# Patient Record
Sex: Male | Born: 1980 | Race: Black or African American | Hispanic: No | Marital: Single | Smoking: Current every day smoker
Health system: Southern US, Community
[De-identification: ages and names within clinical notes are randomized; demographics above are authoritative.]

---

## 2014-05-26 ENCOUNTER — Encounter (HOSPITAL_COMMUNITY): Payer: Self-pay | Admitting: Emergency Medicine

## 2014-05-26 ENCOUNTER — Emergency Department (HOSPITAL_COMMUNITY)
Admission: EM | Admit: 2014-05-26 | Discharge: 2014-05-26 | Disposition: A | Discharge status: Home / Self Care | Payer: Self-pay | Attending: Emergency Medicine | Admitting: Emergency Medicine

## 2014-05-26 DIAGNOSIS — B354 Tinea corporis: Secondary | ICD-10-CM | POA: Insufficient documentation

## 2014-05-26 DIAGNOSIS — K4090 Unilateral inguinal hernia, without obstruction or gangrene, not specified as recurrent: Secondary | ICD-10-CM | POA: Insufficient documentation

## 2014-05-26 DIAGNOSIS — Z792 Long term (current) use of antibiotics: Secondary | ICD-10-CM | POA: Insufficient documentation

## 2014-05-26 DIAGNOSIS — Z720 Tobacco use: Secondary | ICD-10-CM | POA: Insufficient documentation

## 2014-05-26 MED ORDER — DIPHENHYDRAMINE HCL 12.5 MG/5ML PO ELIX
25.0000 mg | ORAL_SOLUTION | Freq: Once | ORAL | Status: AC
  Administered 2014-05-26: 25 mg via ORAL
  Filled 2014-05-26: qty 10

## 2014-05-26 MED ORDER — CLOTRIMAZOLE 1 % EX CREA: TOPICAL_CREAM | CUTANEOUS | Status: DC

## 2014-05-26 NOTE — Discharge Instructions (Signed)
Inguinal Hernia, Adult Muscles help keep everything in the body in its proper place. But if a weak spot in the muscles develops, something can poke through. That is called a hernia. When this happens in the lower part of the belly (abdomen), it is called an inguinal hernia. (It takes its name from a part of the body in this region called the inguinal canal.) A weak spot in the wall of muscles lets some fat or part of the small intestine bulge through. An inguinal hernia can develop at any age. Men get them more often than women. CAUSES  In adults, an inguinal hernia develops over time.  It can be triggered by:  Suddenly straining the muscles of the lower abdomen.  Lifting heavy objects.  Straining to have a bowel movement. Difficult bowel movements (constipation) can lead to this.  Constant coughing. This may be caused by smoking or lung disease.  Being overweight.  Being pregnant.  Working at a job that requires long periods of standing or heavy lifting.  Having had an inguinal hernia before. One type can be an emergency situation. It is called a strangulated inguinal hernia. It develops if part of the small intestine slips through the weak spot and cannot get back into the abdomen. The blood supply can be cut off. If that happens, part of the intestine may die. This situation requires emergency surgery. SYMPTOMS  Often, a small inguinal hernia has no symptoms. It is found when a healthcare provider does a physical exam. Larger hernias usually have symptoms.   In adults, symptoms may include:  A lump in the groin. This is easier to see when the person is standing. It might disappear when lying down.  In men, a lump in the scrotum.  Pain or burning in the groin. This occurs especially when lifting, straining or coughing.  A dull ache or feeling of pressure in the groin.  Signs of a strangulated hernia can include:  A bulge in the groin that becomes very painful and tender to the  touch.  A bulge that turns red or purple.  Fever, nausea and vomiting.  Inability to have a bowel movement or to pass gas. DIAGNOSIS  To decide if you have an inguinal hernia, a healthcare provider will probably do a physical examination.  This will include asking questions about any symptoms you have noticed.  The healthcare provider might feel the groin area and ask you to cough. If an inguinal hernia is felt, the healthcare provider may try to slide it back into the abdomen.  Usually no other tests are needed. TREATMENT  Treatments can vary. The size of the hernia makes a difference. Options include:  Watchful waiting. This is often suggested if the hernia is small and you have had no symptoms.  No medical procedure will be done unless symptoms develop.  You will need to watch closely for symptoms. If any occur, contact your healthcare provider right away.  Surgery. This is used if the hernia is larger or you have symptoms.  Open surgery. This is usually an outpatient procedure (you will not stay overnight in a hospital). An cut (incision) is made through the skin in the groin. The hernia is put back inside the abdomen. The weak area in the muscles is then repaired by herniorrhaphy or hernioplasty. Herniorrhaphy: in this type of surgery, the weak muscles are sewn back together. Hernioplasty: a patch or mesh is used to close the weak area in the abdominal wall.  Laparoscopy.  In this procedure, a surgeon makes small incisions. A thin tube with a tiny video camera (called a laparoscope) is put into the abdomen. The surgeon repairs the hernia with mesh by looking with the video camera and using two long instruments. HOME CARE INSTRUCTIONS   After surgery to repair an inguinal hernia:  You will need to take pain medicine prescribed by your healthcare provider. Follow all directions carefully.  You will need to take care of the wound from the incision.  Your activity will be  restricted for awhile. This will probably include no heavy lifting for several weeks. You also should not do anything too active for a few weeks. When you can return to work will depend on the type of job that you have.  During "watchful waiting" periods, you should:  Maintain a healthy weight.  Eat a diet high in fiber (fruits, vegetables and whole grains).  Drink plenty of fluids to avoid constipation. This means drinking enough water and other liquids to keep your urine clear or pale yellow.  Do not lift heavy objects.  Do not stand for long periods of time.  Quit smoking. This should keep you from developing a frequent cough. SEEK MEDICAL CARE IF:   A bulge develops in your groin area.  You feel pain, a burning sensation or pressure in the groin. This might be worse if you are lifting or straining.  You develop a fever of more than 100.5 F (38.1 C). SEEK IMMEDIATE MEDICAL CARE IF:   Pain in the groin increases suddenly.  A bulge in the groin gets bigger suddenly and does not go down.  For men, there is sudden pain in the scrotum. Or, the size of the scrotum increases.  A bulge in the groin area becomes red or purple and is painful to touch.  You have nausea or vomiting that does not go away.  You feel your heart beating much faster than normal.  You cannot have a bowel movement or pass gas.  You develop a fever of more than 102.0 F (38.9 C). Document Released: 06/17/2008 Document Revised: 04/23/2011 Document Reviewed: 06/17/2008 [facility] Patient Information 2015 [place]. This information is not intended to replace advice given to you by your health care provider. Make sure you discuss any questions you have with your health care provider.   Body Ringworm Ringworm (tinea corporis) is a fungal infection of the skin on the body. This infection is not caused by worms, but is actually caused by a fungus. Fungus normally lives on the top of your skin and can be  useful. However, in the case of ringworms, the fungus grows out of control and causes a skin infection. It can involve any area of skin on the body and can spread easily from one person to another (contagious). Ringworm is a common problem for children, but it can affect adults as well. Ringworm is also often found in athletes, especially wrestlers who share equipment and mats.  CAUSES  Ringworm of the body is caused by a fungus called dermatophyte. It can spread by:  Touchingother people who are infected.  Touchinginfected pets.  Touching or sharingobjects that have been in contact with the infected person or pet (hats, combs, towels, clothing, sports equipment). SYMPTOMS   Itchy, raised red spots and bumps on the skin.  Ring-shaped rash.  Redness near the border of the rash with a clear center.  Dry and scaly skin on or around the rash. Not every person develops a  ring-shaped rash. Some develop only the red, scaly patches. DIAGNOSIS  Most often, ringworm can be diagnosed by performing a skin exam. Your caregiver may choose to take a skin scraping from the affected area. The sample will be examined under the microscope to see if the fungus is present.  TREATMENT  Body ringworm may be treated with a topical antifungal cream or ointment. Sometimes, an antifungal shampoo that can be used on your body is prescribed. You may be prescribed antifungal medicines to take by mouth if your ringworm is severe, keeps coming back, or lasts a long time.  HOME CARE INSTRUCTIONS   Only take over-the-counter or prescription medicines as directed by your caregiver.  Wash the infected area and dry it completely before applying yourcream or ointment.  When using antifungal shampoo to treat the ringworm, leave the shampoo on the body for 3-5 minutes before rinsing.   Wear loose clothing to stop clothes from rubbing and irritating the rash.  Wash or change your bed sheets every night while you have  the rash.  Have your pet treated by your veterinarian if it has the same infection. To prevent ringworm:   Practice good hygiene.  Wear sandals or shoes in public places and showers.  Do not share personal items with others.  Avoid touching red patches of skin on other people.  Avoid touching pets that have bald spots or wash your hands after doing so. SEEK MEDICAL CARE IF:   Your rash continues to spread after 7 days of treatment.  Your rash is not gone in 4 weeks.  The area around your rash becomes red, warm, tender, and swollen. Document Released: 01/27/2000 Document Revised: 10/24/2011 Document Reviewed: 08/13/2011 [facility] Patient Information 2015 [place], [place]. This information is not intended to replace advice given to you by your health care provider. Make sure you discuss any questions you have with your health care provider.

## 2014-05-26 NOTE — ED Notes (Signed)
Pt reports running and falling in the dirt around 2 weeks ago. Has healed abrasion to right hip but approx one week ago a raised rash appeared on chest and bilateral arms. Rash appears to be ringworm. No one else in family has similar rash. No other c/c. Says the rash does itch. Denies F/N/V/D.

## 2014-05-26 NOTE — ED Provider Notes (Signed)
CSN: [id number]     Arrival date & time 05/26/14  1906 History  This chart was scribed for non-physician practitioner, Marlon Peliffany Cherylee Rawlinson, PA-C working with No att. providers found by Angelene GiovanniEmmanuella Mensah, ED Scribe. The patient was seen in room WTR5/WTR5 and the patient's care was started at 7:54 PM    Chief Complaint  Patient presents with  . Rash  . Tinea?    The history is provided by the patient. No language interpreter was used.   HPI Comments: Donald Brandt is a 34 y.o. male who presents to the Emergency Department complaining of a gradually worsening itchy rash on left chest, bilateral arms, and abdomen onset one week ago. He reports that he was running around in the park and fell in the dirt about 2 weeks ago. He denies any fever, abdominal pain, or N/V/D. He denies any rashes on any other areas of his body. He denies any sick contacts.   He also c/o a gradually worsening hernia and would like to know if he needs to go see a specialist about it. - it is not causing him any pain. No change in bowel habits. No N/V/D or fevers.   History reviewed. No pertinent past medical history. History reviewed. No pertinent past surgical history. History reviewed. No pertinent family history. History  Substance Use Topics  . Smoking status: Current Some Day Smoker  . Smokeless tobacco: Not on file  . Alcohol Use: No    Review of Systems  Constitutional: Negative for fever.  Gastrointestinal: Negative for nausea, vomiting, abdominal pain and diarrhea.  Skin: Positive for rash.  All other systems reviewed and are negative.   Allergies  Review of patient's allergies indicates no known allergies.  Home Medications   Prior to Admission medications   Medication Sig Start Date End Date Taking? Authorizing Provider  PENICILLIN V POTASSIUM PO Take 1 tablet by mouth daily as needed (rash).   Yes Historical Provider, MD  clotrimazole (LOTRIMIN) 1 % cream Apply to affected area 2 times daily and  continue 5 days after rash has cleared 05/26/14   Marlon Peliffany Rane Blitch, PA-C   BP 126/75 mmHg  Pulse 68  Temp(Src) 99 F (37.2 C) (Oral)  Resp 16  SpO2 100% Physical Exam  Constitutional: He is oriented to person, place, and time. He appears well-developed and well-nourished. No distress.  HENT:  Head: Normocephalic and atraumatic.  Eyes: Conjunctivae and EOM are normal.  Neck: Neck supple. No tracheal deviation present.  Cardiovascular: Normal rate.   Pulmonary/Chest: Effort normal. No respiratory distress.  Abdominal: Bowel sounds are normal. There is no tenderness. There is no rigidity, no rebound and no guarding. A hernia is present. Hernia confirmed positive in the left inguinal area (soft, easily reduces. No induration or erythema). Hernia confirmed negative in the right inguinal area.  Musculoskeletal: Normal range of motion.  Lymphadenopathy:       Right: No inguinal adenopathy present.       Left: No inguinal adenopathy present.  Neurological: He is alert and oriented to person, place, and time.  Skin: Skin is warm and dry. Rash (scaly erythematous rash i na circular shape. excoriated. ) noted.     Psychiatric: He has a normal mood and affect. His behavior is normal.  Nursing note and vitals reviewed.   ED Course  Procedures (including critical care time) DIAGNOSTIC STUDIES: Oxygen Saturation is 100% on RA, normal by my interpretation.    COORDINATION OF CARE: 7:58 PM- Pt advised of plan for treatment  and pt agrees.    Labs Review Labs Reviewed - No data to display  Imaging Review No results found.   EKG Interpretation None      MDM   Final diagnoses:  Unilateral inguinal hernia without obstruction or gangrene, recurrence not specified  Tinea corporis   Rx: clotrimazole cream rx. Given patient education on how not to spread ring worm. Given return precautions, if rash is not improving in a couple of days it will need to be re-evaluated.   Patient given a  referral to [facility] for evaluation of hernia.  33 y.o.Kamdin Kranz's evaluation in the Emergency Department is complete. It has been determined that no acute conditions requiring further emergency intervention are present at this time. The patient/guardian have been advised of the diagnosis and plan. We have discussed signs and symptoms that warrant return to the ED, such as changes or worsening in symptoms.  Vital signs are stable at discharge. Filed Vitals:   05/26/14 1913  BP: 126/75  Pulse: 68  Temp: 99 F (37.2 C)  Resp: 16    Patient/guardian has voiced understanding and agreed to follow-up with the PCP or specialist.  I personally performed the services described in this documentation, which was scribed in my presence. The recorded information has been reviewed and is accurate.   Marlon Pel, PA-C 05/26/14 2028  Purvis Sheffield, MD 05/27/14 0000

## 2014-06-05 ENCOUNTER — Emergency Department (HOSPITAL_COMMUNITY)
Admission: EM | Admit: 2014-06-05 | Discharge: 2014-06-06 | Disposition: A | Discharge status: Home / Self Care | Payer: Self-pay | Attending: Emergency Medicine | Admitting: Emergency Medicine

## 2014-06-05 ENCOUNTER — Encounter (HOSPITAL_COMMUNITY): Payer: Self-pay | Admitting: Emergency Medicine

## 2014-06-05 ENCOUNTER — Emergency Department (HOSPITAL_COMMUNITY): Payer: Self-pay

## 2014-06-05 DIAGNOSIS — R059 Cough, unspecified: Secondary | ICD-10-CM

## 2014-06-05 DIAGNOSIS — J029 Acute pharyngitis, unspecified: Secondary | ICD-10-CM | POA: Insufficient documentation

## 2014-06-05 DIAGNOSIS — J3489 Other specified disorders of nose and nasal sinuses: Secondary | ICD-10-CM | POA: Insufficient documentation

## 2014-06-05 DIAGNOSIS — R6889 Other general symptoms and signs: Secondary | ICD-10-CM

## 2014-06-05 DIAGNOSIS — Z720 Tobacco use: Secondary | ICD-10-CM | POA: Insufficient documentation

## 2014-06-05 DIAGNOSIS — R509 Fever, unspecified: Secondary | ICD-10-CM | POA: Insufficient documentation

## 2014-06-05 DIAGNOSIS — R0981 Nasal congestion: Secondary | ICD-10-CM | POA: Insufficient documentation

## 2014-06-05 DIAGNOSIS — R05 Cough: Secondary | ICD-10-CM | POA: Insufficient documentation

## 2014-06-05 MED ORDER — ACETAMINOPHEN 325 MG PO TABS
650.0000 mg | ORAL_TABLET | Freq: Four times a day (QID) | ORAL | Status: DC | PRN
  Administered 2014-06-05: 650 mg via ORAL
  Filled 2014-06-05: qty 2

## 2014-06-05 NOTE — ED Notes (Signed)
Pt began running fever and feeling tired last Thursday. Pt reports taking Motrin this morning.

## 2014-06-06 LAB — CBC WITH DIFFERENTIAL/PLATELET
BASOS PCT: 0 % (ref 0–1)
Basophils Absolute: 0 10*3/uL (ref 0.0–0.1)
Eosinophils Absolute: 0.2 10*3/uL (ref 0.0–0.7)
Eosinophils Relative: 2 % (ref 0–5)
HEMATOCRIT: 41 % (ref 39.0–52.0)
HEMOGLOBIN: 13.4 g/dL (ref 13.0–17.0)
LYMPHS ABS: 1.7 10*3/uL (ref 0.7–4.0)
Lymphocytes Relative: 24 % (ref 12–46)
MCH: 29.5 pg (ref 26.0–34.0)
MCHC: 32.7 g/dL (ref 30.0–36.0)
MCV: 90.1 fL (ref 78.0–100.0)
MONO ABS: 0.9 10*3/uL (ref 0.1–1.0)
Monocytes Relative: 12 % (ref 3–12)
Neutro Abs: 4.3 10*3/uL (ref 1.7–7.7)
Neutrophils Relative %: 62 % (ref 43–77)
Platelets: 177 10*3/uL (ref 150–400)
RBC: 4.55 MIL/uL (ref 4.22–5.81)
RDW: 12.9 % (ref 11.5–15.5)
WBC: 7 10*3/uL (ref 4.0–10.5)

## 2014-06-06 LAB — COMPREHENSIVE METABOLIC PANEL
ALK PHOS: 47 U/L (ref 39–117)
ALT: 72 U/L — ABNORMAL HIGH (ref 0–53)
AST: 52 U/L — AB (ref 0–37)
Albumin: 4.4 g/dL (ref 3.5–5.2)
Anion gap: 4 — ABNORMAL LOW (ref 5–15)
BUN: 7 mg/dL (ref 6–23)
CO2: 27 mmol/L (ref 19–32)
CREATININE: 1.35 mg/dL (ref 0.50–1.35)
Calcium: 8.4 mg/dL (ref 8.4–10.5)
Chloride: 104 mmol/L (ref 96–112)
GFR calc non Af Amer: 67 mL/min — ABNORMAL LOW (ref >90)
GFR, EST AFRICAN AMERICAN: 77 mL/min — AB (ref >90)
Glucose, Bld: 95 mg/dL (ref 70–99)
Potassium: 3.6 mmol/L (ref 3.5–5.1)
SODIUM: 135 mmol/L (ref 135–145)
TOTAL PROTEIN: 7.6 g/dL (ref 6.0–8.3)
Total Bilirubin: 0.6 mg/dL (ref 0.3–1.2)

## 2014-06-06 MED ORDER — BENZONATATE 100 MG PO CAPS: 100.0000 mg | ORAL_CAPSULE | Freq: Three times a day (TID) | ORAL | Status: DC

## 2014-06-06 MED ORDER — IBUPROFEN 600 MG PO TABS: 600.0000 mg | ORAL_TABLET | Freq: Four times a day (QID) | ORAL | Status: DC | PRN

## 2014-06-06 MED ORDER — IBUPROFEN 800 MG PO TABS
800.0000 mg | ORAL_TABLET | Freq: Once | ORAL | Status: AC
  Administered 2014-06-06: 800 mg via ORAL
  Filled 2014-06-06: qty 1

## 2014-06-06 MED ORDER — SALINE SPRAY 0.65 % NA SOLN: 1.0000 | NASAL | Status: DC | PRN

## 2014-06-06 NOTE — Discharge Instructions (Signed)

## 2014-06-06 NOTE — ED Notes (Addendum)
Pt ambulated well in the hall---- pt's O2 sat remains 98% on room air; pt denies any shortness of breath while ambulating.

## 2014-06-06 NOTE — ED Provider Notes (Signed)
CSN: [id number]     Arrival date & time 06/05/14  2312 History   First MD Initiated Contact with Patient 06/06/14 0107     Chief Complaint  Patient presents with  . Fever    (Consider location/radiation/quality/duration/timing/severity/associated sxs/prior Treatment) HPI Comments: Patient is a 34 year old male who presents to the emergency department for further evaluation of fever. He reports that he began with a cough 3 days ago which has been dry and nonproductive. He states that he developed chills and a headache yesterday. Temperature at home was 102.69F. Patient took Motrin at noon yesterday and cough medication at 1700. He reports little improvement in his symptoms with this. He has had associated nasal congestion with rhinorrhea as well as a mild sore throat. Patient denies a headache now that his fever has resolved. He further denies vomiting, diarrhea, shortness of breath, and chest pain. He states his daughter is sick with similar symptoms.  Patient is a 34 y.o. male presenting with fever. The history is provided by the patient. No language interpreter was used.  Fever Associated symptoms: chills, congestion, cough, rhinorrhea and sore throat   Associated symptoms: no diarrhea and no vomiting     History reviewed. No pertinent past medical history. History reviewed. No pertinent past surgical history. History reviewed. No pertinent family history. History  Substance Use Topics  . Smoking status: Current Some Day Smoker  . Smokeless tobacco: Not on file  . Alcohol Use: No    Review of Systems  Constitutional: Positive for fever and chills.  HENT: Positive for congestion, rhinorrhea and sore throat.   Respiratory: Positive for cough. Negative for shortness of breath.   Gastrointestinal: Negative for vomiting and diarrhea.  Neurological: Negative for syncope.  All other systems reviewed and are negative.   Allergies  Review of patient's allergies indicates no known  allergies.  Home Medications   Prior to Admission medications   Medication Sig Start Date End Date Taking? Authorizing Provider  benzonatate (TESSALON) 100 MG capsule Take 1 capsule (100 mg total) by mouth every 8 (eight) hours. 06/06/14   Antony MaduraKelly Lestine Rahe, PA-C  clotrimazole (LOTRIMIN) 1 % cream Apply to affected area 2 times daily and continue 5 days after rash has cleared Patient not taking: Reported on 06/06/2014 05/26/14   Marlon Peliffany Greene, PA-C  ibuprofen (ADVIL,MOTRIN) 600 MG tablet Take 1 tablet (600 mg total) by mouth every 6 (six) hours as needed. 06/06/14   Antony MaduraKelly Marleena Shubert, PA-C  sodium chloride (OCEAN) 0.65 % SOLN nasal spray Place 1 spray into both nostrils as needed for congestion. 06/06/14   Antony MaduraKelly Nyeshia Mysliwiec, PA-C   BP 126/78 mmHg  Pulse 86  Temp(Src) 99.7 F (37.6 C) (Oral)  Resp 18  Ht 5\' 11"  (1.803 m)  Wt 156 lb (70.761 kg)  BMI 21.77 kg/m2  SpO2 98%   Physical Exam  Constitutional: He is oriented to person, place, and time. He appears well-developed and well-nourished. No distress.  HENT:  Head: Normocephalic and atraumatic.  Right Ear: Tympanic membrane, external ear and ear canal normal.  Left Ear: Tympanic membrane, external ear and ear canal normal.  Nose: Mucosal edema present. No rhinorrhea.  Mouth/Throat: Uvula is midline and oropharynx is clear and moist. No oropharyngeal exudate.  Uvula midline. Patient tolerating secretions without difficulty  Eyes: Conjunctivae and EOM are normal. No scleral icterus.  Neck: Normal range of motion.  No nuchal rigidity or meningismus  Cardiovascular: Normal rate, regular rhythm and intact distal pulses.   Pulmonary/Chest: Effort normal and breath  sounds normal. No respiratory distress. He has no wheezes. He has no rales.  Respirations even and unlabored. Lungs clear.  Abdominal: Soft. He exhibits no distension. There is no tenderness. There is no rebound.  Soft, nontender  Musculoskeletal: Normal range of motion.  Neurological: He is  alert and oriented to person, place, and time. He exhibits normal muscle tone. Coordination normal.  GCS 15. Speech is goal oriented. Patient moves extremities without ataxia.  Skin: Skin is warm and dry. No rash noted. He is not diaphoretic. No erythema. No pallor.  Psychiatric: He has a normal mood and affect. His behavior is normal.  Nursing note and vitals reviewed.   ED Course  Procedures (including critical care time) Labs Review Labs Reviewed  COMPREHENSIVE METABOLIC PANEL - Abnormal; Notable for the following:    AST 52 (*)    ALT 72 (*)    GFR calc non Af Amer 67 (*)    GFR calc Af Amer 77 (*)    Anion gap 4 (*)    All other components within normal limits  CBC WITH DIFFERENTIAL/PLATELET    Imaging Review Dg Chest 2 View  06/06/2014   CLINICAL DATA:  Fever and generalized malaise for 2 days.  EXAM: CHEST  2 VIEW  COMPARISON:  None.  FINDINGS: The heart size and mediastinal contours are within normal limits. Both lungs are clear. The visualized skeletal structures are unremarkable.  IMPRESSION: No active cardiopulmonary disease.   Electronically Signed   By: Davonna Belling M.D.   On: 06/06/2014 01:54     EKG Interpretation None      MDM   Final diagnoses:  Flu-like symptoms    Patient with symptoms consistent with influenza. Vitals are stable, low-grade fever. No signs of dehydration, tolerating PO's. Lungs are clear. Chest x-ray negative for pneumonia. Patient ambulatory without hypoxia in the ED. Discussed the cost versus benefit of Tamiflu treatment with the patient.  The patient understands that symptoms are greater than the recommended 24-48 hour window of treatment. Patient will be discharged with instructions to orally hydrate, rest, and use over-the-counter medications for body aches and fever. Patient also given a cough suppressant. Return precautions discussed and provided. Patient agreeable to plan with no unaddressed concerns. Patient discharged in good  condition; VSS.   Filed Vitals:   06/06/14 0043 06/06/14 0106 06/06/14 0131 06/06/14 0216  BP:   117/66 126/78  Pulse:   76 86  Temp: 100.5 F (38.1 C) 100.5 F (38.1 C) 100.3 F (37.9 C) 99.7 F (37.6 C)  TempSrc: Oral Oral Oral Oral  Resp:   20 18  Height:      Weight:      SpO2:   98% 98%       Antony Madura, PA-C 06/06/14 0254  April Palumbo, MD 06/06/14 0410

## 2015-01-29 ENCOUNTER — Emergency Department (INDEPENDENT_AMBULATORY_CARE_PROVIDER_SITE_OTHER)
Admission: EM | Admit: 2015-01-29 | Discharge: 2015-01-29 | Disposition: A | Discharge status: Home / Self Care | Payer: Self-pay | Source: Home / Self Care | Attending: Emergency Medicine | Admitting: Emergency Medicine

## 2015-01-29 ENCOUNTER — Encounter (HOSPITAL_COMMUNITY): Payer: Self-pay | Admitting: *Deleted

## 2015-01-29 DIAGNOSIS — R0981 Nasal congestion: Secondary | ICD-10-CM

## 2015-01-29 DIAGNOSIS — J069 Acute upper respiratory infection, unspecified: Secondary | ICD-10-CM

## 2015-01-29 MED ORDER — MOMETASONE FUROATE 50 MCG/ACT NA SUSP: 2.0000 | Freq: Every day | NASAL | Status: DC

## 2015-01-29 NOTE — ED Provider Notes (Signed)
CSN: [id number]     Arrival date & time 01/29/15  1735 History   First MD Initiated Contact with Patient 01/29/15 1835     Chief Complaint  Patient presents with  . Nasal Congestion   (Consider location/radiation/quality/duration/timing/severity/associated sxs/prior Treatment) HPI Comments: Patient presents with nasal congestion x 2 days. No fever or chills. No cough. No malaise. No sore throat. Using a nasal spray but ran out.   The history is provided by the patient.    History reviewed. No pertinent past medical history. History reviewed. No pertinent past surgical history. No family history on file. Social History  Substance Use Topics  . Smoking status: Current Every Day Smoker  . Smokeless tobacco: None  . Alcohol Use: Yes     Comment: occasional    Review of Systems  Constitutional: Negative for fever, chills and fatigue.  HENT: Positive for congestion. Negative for rhinorrhea, sinus pressure and sore throat.   Eyes: Negative.   Respiratory: Negative.   Skin: Negative.   Neurological: Negative.     Allergies  Review of patient's allergies indicates no known allergies.  Home Medications   Prior to Admission medications   Medication Sig Start Date End Date Taking? Authorizing Provider  benzonatate (TESSALON) 100 MG capsule Take 1 capsule (100 mg total) by mouth every 8 (eight) hours. 06/06/14   Antony MaduraKelly Humes, PA-C  clotrimazole (LOTRIMIN) 1 % cream Apply to affected area 2 times daily and continue 5 days after rash has cleared Patient not taking: Reported on 06/06/2014 05/26/14   Marlon Peliffany Greene, PA-C  ibuprofen (ADVIL,MOTRIN) 600 MG tablet Take 1 tablet (600 mg total) by mouth every 6 (six) hours as needed. 06/06/14   Antony MaduraKelly Humes, PA-C  mometasone (NASONEX) 50 MCG/ACT nasal spray Place 2 sprays into the nose daily. 01/29/15   Riki SheerMichelle G Young, PA-C  sodium chloride (OCEAN) 0.65 % SOLN nasal spray Place 1 spray into both nostrils as needed for congestion. 06/06/14   Antony MaduraKelly  Humes, PA-C   Meds Ordered and Administered this Visit  Medications - No data to display  BP 118/79 mmHg  Pulse 69  Temp(Src) 98.5 F (36.9 C) (Oral)  Resp 20  SpO2 99% No data found.   Physical Exam  Constitutional: He is oriented to person, place, and time. He appears well-developed and well-nourished. No distress.  HENT:  Head: Normocephalic and atraumatic.  Right Ear: External ear normal.  Left Ear: External ear normal.  Mouth/Throat: Oropharynx is clear and moist. No oropharyngeal exudate.  Nasal turbinates with erythema and mild swelling, clear drainage  Neck: Normal range of motion.  Pulmonary/Chest: Effort normal and breath sounds normal. No respiratory distress.  Lymphadenopathy:    He has no cervical adenopathy.  Neurological: He is alert and oriented to person, place, and time.  Skin: Skin is warm and dry. No rash noted. He is not diaphoretic.  Psychiatric: His behavior is normal.  Nursing note and vitals reviewed.   ED Course  Procedures (including critical care time)  Labs Review Labs Reviewed - No data to display  Imaging Review No results found.   Visual Acuity Review  Right Eye Distance:   Left Eye Distance:   Bilateral Distance:    Right Eye Near:   Left Eye Near:    Bilateral Near:         MDM   1. URI (upper respiratory infection)   2. Nasal congestion    No indication for an antibiotic today. Treat symptomatically with nasal spray, NSAID's and  fluids. F/U if worsens   Riki Sheer, PA-C 01/29/15 1610

## 2015-01-29 NOTE — Discharge Instructions (Signed)
Upper Respiratory Infection, Adult Most upper respiratory infections (URIs) are a viral infection of the air passages leading to the lungs. A URI affects the nose, throat, and upper air passages. The most common type of URI is nasopharyngitis and is typically referred to as "the common cold." URIs run their course and usually go away on their own. Most of the time, a URI does not require medical attention, but sometimes a bacterial infection in the upper airways can follow a viral infection. This is called a secondary infection. Sinus and middle ear infections are common types of secondary upper respiratory infections. Bacterial pneumonia can also complicate a URI. A URI can worsen asthma and chronic obstructive pulmonary disease (COPD). Sometimes, these complications can require emergency medical care and may be life threatening.  CAUSES Almost all URIs are caused by viruses. A virus is a type of germ and can spread from one person to another.  RISKS FACTORS You may be at risk for a URI if:   You smoke.   You have chronic heart or lung disease.  You have a weakened defense (immune) system.   You are very Atiyana Brandt or very old.   You have nasal allergies or asthma.  You work in crowded or poorly ventilated areas.  You work in health care facilities or schools. SIGNS AND SYMPTOMS  Symptoms typically develop 2-3 days after you come in contact with a cold virus. Most viral URIs last 7-10 days. However, viral URIs from the influenza virus (flu virus) can last 14-18 days and are typically more severe. Symptoms may include:   Runny or stuffy (congested) nose.   Sneezing.   Cough.   Sore throat.   Headache.   Fatigue.   Fever.   Loss of appetite.   Pain in your forehead, behind your eyes, and over your cheekbones (sinus pain).  Muscle aches.  DIAGNOSIS  Your health care provider may diagnose a URI by:  Physical exam.  Tests to check that your symptoms are not due to  another condition such as:  Strep throat.  Sinusitis.  Pneumonia.  Asthma. TREATMENT  A URI goes away on its own with time. It cannot be cured with medicines, but medicines may be prescribed or recommended to relieve symptoms. Medicines may help:  Reduce your fever.  Reduce your cough.  Relieve nasal congestion. HOME CARE INSTRUCTIONS   Take medicines only as directed by your health care provider.   Gargle warm saltwater or take cough drops to comfort your throat as directed by your health care provider.  Use a warm mist humidifier or inhale steam from a shower to increase air moisture. This may make it easier to breathe.  Drink enough fluid to keep your urine clear or pale yellow.   Eat soups and other clear broths and maintain good nutrition.   Rest as needed.   Return to work when your temperature has returned to normal or as your health care provider advises. You may need to stay home longer to avoid infecting others. You can also use a face mask and careful hand washing to prevent spread of the virus.  Increase the usage of your inhaler if you have asthma.   Do not use any tobacco products, including cigarettes, chewing tobacco, or electronic cigarettes. If you need help quitting, ask your health care provider. PREVENTION  The best way to protect yourself from getting a cold is to practice good hygiene.   Avoid oral or hand contact with people with cold  symptoms.   Wash your hands often if contact occurs.  There is no clear evidence that vitamin C, vitamin E, echinacea, or exercise reduces the chance of developing a cold. However, it is always recommended to get plenty of rest, exercise, and practice good nutrition.  SEEK MEDICAL CARE IF:   You are getting worse rather than better.   Your symptoms are not controlled by medicine.   You have chills.  You have worsening shortness of breath.  You have brown or red mucus.  You have yellow or brown nasal  discharge.  You have pain in your face, especially when you bend forward.  You have a fever.  You have swollen neck glands.  You have pain while swallowing.  You have white areas in the back of your throat. SEEK IMMEDIATE MEDICAL CARE IF:   You have severe or persistent:  Headache.  Ear pain.  Sinus pain.  Chest pain.  You have chronic lung disease and any of the following:  Wheezing.  Prolonged cough.  Coughing up blood.  A change in your usual mucus.  You have a stiff neck.  You have changes in your:  Vision.  Hearing.  Thinking.  Mood. MAKE SURE YOU:   Understand these instructions.  Will watch your condition.  Will get help right away if you are not doing well or get worse.   This information is not intended to replace advice given to you by your health care provider. Make sure you discuss any questions you have with your health care provider.   No signs of a bacterial infection or need for an antibiotic. Suggest use of Motrin 600mg  every 8 hours with Sudafed (behind the counter) for congestion. Nasal spray will help also. Drink plenty of fluids. If you worsen please return. Feel better.    Document Released: 07/25/2000 Document Revised: 06/15/2014 Document Reviewed: 05/06/2013 Elsevier Interactive Patient Education [employer] Inc.

## 2015-01-29 NOTE — ED Notes (Signed)
C/O nasal congestion x 2 days.  Has taken Nyquil and fiancee's nasal spray with relief, but it ran out.  Has felt slightly feverish.  Denies cough.

## 2015-01-29 NOTE — ED Notes (Signed)
Pt discharged in error 

## 2015-04-03 ENCOUNTER — Encounter (HOSPITAL_COMMUNITY): Payer: Self-pay | Admitting: *Deleted

## 2015-04-03 ENCOUNTER — Emergency Department (HOSPITAL_COMMUNITY): Payer: Self-pay

## 2015-04-03 ENCOUNTER — Emergency Department (HOSPITAL_COMMUNITY)
Admission: EM | Admit: 2015-04-03 | Discharge: 2015-04-03 | Disposition: A | Discharge status: Home / Self Care | Payer: Self-pay | Attending: Emergency Medicine | Admitting: Emergency Medicine

## 2015-04-03 DIAGNOSIS — Z7951 Long term (current) use of inhaled steroids: Secondary | ICD-10-CM | POA: Insufficient documentation

## 2015-04-03 DIAGNOSIS — Y9289 Other specified places as the place of occurrence of the external cause: Secondary | ICD-10-CM | POA: Insufficient documentation

## 2015-04-03 DIAGNOSIS — X58XXXA Exposure to other specified factors, initial encounter: Secondary | ICD-10-CM | POA: Insufficient documentation

## 2015-04-03 DIAGNOSIS — Y998 Other external cause status: Secondary | ICD-10-CM | POA: Insufficient documentation

## 2015-04-03 DIAGNOSIS — S83411A Sprain of medial collateral ligament of right knee, initial encounter: Secondary | ICD-10-CM | POA: Insufficient documentation

## 2015-04-03 DIAGNOSIS — Y9344 Activity, trampolining: Secondary | ICD-10-CM | POA: Insufficient documentation

## 2015-04-03 DIAGNOSIS — Z79899 Other long term (current) drug therapy: Secondary | ICD-10-CM | POA: Insufficient documentation

## 2015-04-03 DIAGNOSIS — J209 Acute bronchitis, unspecified: Secondary | ICD-10-CM | POA: Insufficient documentation

## 2015-04-03 DIAGNOSIS — F17210 Nicotine dependence, cigarettes, uncomplicated: Secondary | ICD-10-CM | POA: Insufficient documentation

## 2015-04-03 MED ORDER — IBUPROFEN 800 MG PO TABS: 800.0000 mg | ORAL_TABLET | Freq: Three times a day (TID) | ORAL | Status: DC

## 2015-04-03 MED ORDER — IBUPROFEN 800 MG PO TABS
800.0000 mg | ORAL_TABLET | Freq: Once | ORAL | Status: AC
  Administered 2015-04-03: 800 mg via ORAL
  Filled 2015-04-03: qty 1

## 2015-04-03 NOTE — ED Notes (Signed)
PT ambulated to [facility] with crutches that wife opened on her own not waiting for assistance from nursing staff. Pt refused ace wrao and knee imobalizer .

## 2015-04-03 NOTE — ED Notes (Signed)
Wife opening cover on crurches.

## 2015-04-03 NOTE — Discharge Instructions (Signed)
Combined Knee Ligament Sprain Combined knee ligament sprain is a tear of more than one of the major ligaments of the knee. The four knee ligaments are the anterior cruciate ligament (ACL), posterior cruciate ligament (PCL), medial collateral ligament (MCL) and lateral collateral ligament (LCL). Ligaments connect bones. They often cross a joint to hold the bones together. The ligaments of the knee keep the thigh bone (femur) and shinbone (tibia) in alignment. These ligaments allow the joint to move within a certain range of motion. Movement outside this range causes a ligament strain. Injury to multiple ligaments at the same time results in difficulty playing sports and in daily living. The most common multiple knee ligament injury involves the ACL and MCL. SYMPTOMS   A "popping" sound heard or felt at the time of injury.  Inability to continue activity after injury.  Inflammation of the knee within 6 hours after injury.  Possibly, deformity of the knee.  Inability to straighten the knee.  Feeling of the knee giving way or buckling.  Sometimes, locking of the knee, if the joint cartilage (meniscus) is injured.  Rarely, numbness, weakness, paralysis, discoloration, or coldness, due to nerve or blood vessel injury. CAUSES  Spraining of multiple ligaments occurs when a force is placed on the ligaments that exceeds their strength. This is often caused by a direct hit (trauma). It may also be caused by a non-contact injury (hyperextending the knee while twisting it).  RISK INCREASES WITH:  Contact sports (football, rugby, lacrosse). Sports that involve pivoting, jumping, cutting, or changing direction (basketball, gymnastics, soccer, volleyball). Sports on uneven ground (cross-country running, soccer).  Poor strength and/or flexibility.  Improper fitted or padded equipment. PREVENTION  Warm up and stretch properly before activity.  Maintain physical fitness:  Thigh, leg, and knee  flexibility.  Muscle strength and endurance.  Learn and use proper exercise technique.  Wear proper and well fitting equipment (correct length of cleats for surface). PROGNOSIS  Without treatment, the knee will continue to give way and become vulnerable to recurring injury. Recurring injury can happen during athletics or daily living. If the injury includes damage to a nerve or artery, the chance of a poor outcome increases. Surgery is often needed to regain stability of the knee. RELATED COMPLICATIONS  Frequently recurring symptoms, including:  Knee giving way.  Joint instability.  Inflammation.  Injury to the joint cartilage (meniscus). This may result in locking and/or swelling of the knee.  Injury to joint (articular) cartilage of the thigh bone or shinbone. This may result in arthritis of the knee.  Injury to other ligaments of the knee.  Knee stiffness (loss of knee motion).  Permanent injury to nerves (numbness, weakness, or paralysis) or arteries.  Removal (amputation) of the leg, due to nerve or artery injury. TREATMENT  Treatment first involves medicine and ice, to reduce pain and inflammation. Crutches may be advised, to decrease pain while walking. The knee may be restrained. Rehabilitation focuses on reducing swelling, regaining range of motion, and regaining muscle control and strength. It may also include receiving proper use training, wearing a brace, and education. (Avoid sports that involve pivoting, cutting, changing direction, jumping and landing). Surgery often offers the best chance for full recovery. Surgery from combined ACL/MCL injury involves replacement (reconstruction) of the ACL. This also allows for MCL healing. Despite surgery, some athletes may never return to their prior level of competition. The ability to return to sports depends on the related injuries and demands of the sport.  MEDICATION  If pain medicine is needed, nonsteroidal  anti-inflammatory medicines (aspirin and ibuprofen), or other minor pain relievers (acetaminophen), are often advised.  Do not take pain medicine for 7 days before surgery.  Stronger pain relievers may be prescribed. Use only as directed and only as much as you need.  Contact your caregiver immediately if any bleeding, stomach upset, or signs of an allergic reaction occur. COLD THERAPY  Cold treatment (icing) should be applied for 10 to 15 minutes every 2 to 3 hours for inflammation and pain, and immediately after activity that aggravates your symptoms. Use ice packs or an ice massage. SEEK MEDICAL CARE IF:   Symptoms get worse or do not improve in 6 weeks, despite treatment.  After injury or surgery, any of the following occur:  Pain, numbness, coldness, or a blue, gray, or dark color occurs in the foot or toenails.  Increased pain, swelling, redness, drainage of fluids, or bleeding in the affected area.  Signs of infection (headache, muscle aches, dizziness, or a general ill feeling with fever).  New, unexplained symptoms develop. (Drugs used in treatment may produce side effects.)   This information is not intended to replace advice given to you by your health care provider. Make sure you discuss any questions you have with your health care provider.   Document Released: 01/29/2005 Document Revised: 04/23/2011 Document Reviewed: 04/02/2014 Elsevier Interactive Patient Education 2016 [employer].   Adult nurse and RICE WHAT DOES AN ELASTIC BANDAGE DO? Elastic bandages come in different shapes and sizes. They generally provide support to your injury and reduce swelling while you are healing, but they can perform different functions. Your health care provider will help you to decide what is best for your protection, recovery, or rehabilitation following an injury. WHAT ARE SOME GENERAL TIPS FOR USING AN ELASTIC BANDAGE?  Use the bandage as directed by the maker of the bandage  that you are using.  Do not wrap the bandage too tightly. This may cut off the circulation in the arm or leg in the area below the bandage.  If part of your body beyond the bandage becomes blue, numb, cold, swollen, or is more painful, your bandage is most likely too tight. If this occurs, remove your bandage and reapply it more loosely.  See your health care provider if the bandage seems to be making your problems worse rather than better.  An elastic bandage should be removed and reapplied every 3-4 hours or as directed by your health care provider. WHAT IS RICE? The routine care of many injuries includes rest, ice, compression, and elevation (RICE therapy).  Rest Rest is required to allow your body to heal. Generally, you can resume your routine activities when you are comfortable and have been given permission by your health care provider. Ice Icing your injury helps to keep the swelling down and it reduces pain. Do not apply ice directly to your skin.  Put ice in a plastic bag.  Place a towel between your skin and the bag.  Leave the ice on for 20 minutes, 2-3 times per day. Do this for as long as you are directed by your health care provider. Compression Compression helps to keep swelling down, gives support, and helps with discomfort. Compression may be done with an elastic bandage. Elevation Elevation helps to reduce swelling and it decreases pain. If possible, your injured area should be placed at or above the level of your heart or the center of your chest. WHEN SHOULD I SEEK  MEDICAL CARE? You should seek medical care if:  You have persistent pain and swelling.  Your symptoms are getting worse rather than improving. These symptoms may indicate that further evaluation or further X-rays are needed. Sometimes, X-rays may not show a small broken bone (fracture) until a number of days later. Make a follow-up appointment with your health care provider. Ask when your X-ray results  will be ready. Make sure that you get your X-ray results. WHEN SHOULD I SEEK IMMEDIATE MEDICAL CARE? You should seek immediate medical care if:  You have a sudden onset of severe pain at or below the area of your injury.  You develop redness or increased swelling around your injury.  You have tingling or numbness at or below the area of your injury that does not improve after you remove the elastic bandage.   This information is not intended to replace advice given to you by your health care provider. Make sure you discuss any questions you have with your health care provider.   Document Released: 07/21/2001 Document Revised: 10/20/2014 Document Reviewed: 09/14/2013 Elsevier Interactive Patient Education 2016 Elsevier Inc.  Adult nurse and RICE WHAT DOES AN ELASTIC BANDAGE DO? Elastic bandages come in different shapes and sizes. They generally provide support to your injury and reduce swelling while you are healing, but they can perform different functions. Your health care provider will help you to decide what is best for your protection, recovery, or rehabilitation following an injury. WHAT ARE SOME GENERAL TIPS FOR USING AN ELASTIC BANDAGE?  Use the bandage as directed by the maker of the bandage that you are using.  Do not wrap the bandage too tightly. This may cut off the circulation in the arm or leg in the area below the bandage.  If part of your body beyond the bandage becomes blue, numb, cold, swollen, or is more painful, your bandage is most likely too tight. If this occurs, remove your bandage and reapply it more loosely.  See your health care provider if the bandage seems to be making your problems worse rather than better.  An elastic bandage should be removed and reapplied every 3-4 hours or as directed by your health care provider. WHAT IS RICE? The routine care of many injuries includes rest, ice, compression, and elevation (RICE therapy).  Rest Rest is required  to allow your body to heal. Generally, you can resume your routine activities when you are comfortable and have been given permission by your health care provider. Ice Icing your injury helps to keep the swelling down and it reduces pain. Do not apply ice directly to your skin.  Put ice in a plastic bag.  Place a towel between your skin and the bag.  Leave the ice on for 20 minutes, 2-3 times per day. Do this for as long as you are directed by your health care provider. Compression Compression helps to keep swelling down, gives support, and helps with discomfort. Compression may be done with an elastic bandage. Elevation Elevation helps to reduce swelling and it decreases pain. If possible, your injured area should be placed at or above the level of your heart or the center of your chest. WHEN SHOULD I SEEK MEDICAL CARE? You should seek medical care if:  You have persistent pain and swelling.  Your symptoms are getting worse rather than improving. These symptoms may indicate that further evaluation or further X-rays are needed. Sometimes, X-rays may not show a small broken bone (fracture) until a number of  days later. Make a follow-up appointment with your health care provider. Ask when your X-ray results will be ready. Make sure that you get your X-ray results. WHEN SHOULD I SEEK IMMEDIATE MEDICAL CARE? You should seek immediate medical care if:  You have a sudden onset of severe pain at or below the area of your injury.  You develop redness or increased swelling around your injury.  You have tingling or numbness at or below the area of your injury that does not improve after you remove the elastic bandage.   This information is not intended to replace advice given to you by your health care provider. Make sure you discuss any questions you have with your health care provider.   Document Released: 07/21/2001 Document Revised: 10/20/2014 Document Reviewed: 09/14/2013 Elsevier  Interactive Patient Education 2016 Elsevier Inc.    Acute Bronchitis Bronchitis is inflammation of the airways that extend from the windpipe into the lungs (bronchi). The inflammation often causes mucus to develop. This leads to a cough, which is the most common symptom of bronchitis.  In acute bronchitis, the condition usually develops suddenly and goes away over time, usually in a couple weeks. Smoking, allergies, and asthma can make bronchitis worse. Repeated episodes of bronchitis may cause further lung problems.  CAUSES Acute bronchitis is most often caused by the same virus that causes a cold. The virus can spread from person to person (contagious) through coughing, sneezing, and touching contaminated objects. SIGNS AND SYMPTOMS   Cough.   Fever.   Coughing up mucus.   Body aches.   Chest congestion.   Chills.   Shortness of breath.   Sore throat.  DIAGNOSIS  Acute bronchitis is usually diagnosed through a physical exam. Your health care provider will also ask you questions about your medical history. Tests, such as chest X-rays, are sometimes done to rule out other conditions.  TREATMENT  Acute bronchitis usually goes away in a couple weeks. Oftentimes, no medical treatment is necessary. Medicines are sometimes given for relief of fever or cough. Antibiotic medicines are usually not needed but may be prescribed in certain situations. In some cases, an inhaler may be recommended to help reduce shortness of breath and control the cough. A cool mist vaporizer may also be used to help thin bronchial secretions and make it easier to clear the chest.  HOME CARE INSTRUCTIONS  Get plenty of rest.   Drink enough fluids to keep your urine clear or pale yellow (unless you have a medical condition that requires fluid restriction). Increasing fluids may help thin your respiratory secretions (sputum) and reduce chest congestion, and it will prevent dehydration.   Take  medicines only as directed by your health care provider.  If you were prescribed an antibiotic medicine, finish it all even if you start to feel better.  Avoid smoking and secondhand smoke. Exposure to cigarette smoke or irritating chemicals will make bronchitis worse. If you are a smoker, consider using nicotine gum or skin patches to help control withdrawal symptoms. Quitting smoking will help your lungs heal faster.   Reduce the chances of another bout of acute bronchitis by washing your hands frequently, avoiding people with cold symptoms, and trying not to touch your hands to your mouth, nose, or eyes.   Keep all follow-up visits as directed by your health care provider.  SEEK MEDICAL CARE IF: Your symptoms do not improve after 1 week of treatment.  SEEK IMMEDIATE MEDICAL CARE IF:  You develop an increased fever or  chills.   You have chest pain.   You have severe shortness of breath.  You have bloody sputum.   You develop dehydration.  You faint or repeatedly feel like you are going to pass out.  You develop repeated vomiting.  You develop a severe headache. MAKE SURE YOU:   Understand these instructions.  Will watch your condition.  Will get help right away if you are not doing well or get worse.   This information is not intended to replace advice given to you by your health care provider. Make sure you discuss any questions you have with your health care provider.   Document Released: 03/08/2004 Document Revised: 02/19/2014 Document Reviewed: 07/22/2012 Elsevier Interactive Patient Education [employer]

## 2015-04-03 NOTE — ED Notes (Signed)
Pt reports RT knee pain after [place] on [place]. Pt reports he could not sleep last night due to pain. Pt has applied ice to RT knee and took  Ibuprofen with out relief. Pt secondary complaint is a cough for 2 weeks.

## 2015-04-03 NOTE — ED Provider Notes (Signed)
CSN: [id number]     Arrival date & time 04/03/15  0747 History   First MD Initiated Contact with Patient 04/03/15 [phone]     Chief Complaint  Patient presents with  . Knee Pain  . Cough     (Consider location/radiation/quality/duration/timing/severity/associated sxs/prior Treatment) HPI Chief complaint is any injury. Patient was at trampoline park yesterday and with a certain jump he heard a pop in the knee on the right. He reports since then he has had severe pain on the inside of his right knee. He has been limping continuously since the injury. He reports it hurts to put weight on it or to bend it significantly.  Second complaint is cough. Patient has had a cough for approximately 2 weeks. Family members have also had cough. No fever. Intermittent sputum production. No shortness of breath. Copious nasal discharge. No chest pain. No general myalgias or GI symptoms. History reviewed. No pertinent past medical history. History reviewed. No pertinent past surgical history. History reviewed. No pertinent family history. Social History  Substance Use Topics  . Smoking status: Current Every Day Smoker    Types: Cigarettes  . Smokeless tobacco: None  . Alcohol Use: Yes     Comment: occasional    Review of Systems  10 Systems reviewed and are negative for acute change except as noted in the HPI.  Allergies  Review of patient's allergies indicates no known allergies.  Home Medications   Prior to Admission medications   Medication Sig Start Date End Date Taking? Authorizing Provider  ibuprofen (ADVIL,MOTRIN) 600 MG tablet Take 1 tablet (600 mg total) by mouth every 6 (six) hours as needed. Patient taking differently: Take 600 mg by mouth 2 (two) times daily as needed for moderate pain.  06/06/14  Yes Antony Madura, PA-C  benzonatate (TESSALON) 100 MG capsule Take 1 capsule (100 mg total) by mouth every 8 (eight) hours. Patient not taking: Reported on 04/03/2015 06/06/14   Antony Madura, PA-C   clotrimazole (LOTRIMIN) 1 % cream Apply to affected area 2 times daily and continue 5 days after rash has cleared Patient not taking: Reported on 04/03/2015 05/26/14   Marlon Pel, PA-C  ibuprofen (ADVIL,MOTRIN) 800 MG tablet Take 1 tablet (800 mg total) by mouth 3 (three) times daily. 04/03/15   Arby Barrette, MD  mometasone (NASONEX) 50 MCG/ACT nasal spray Place 2 sprays into the nose daily. Patient not taking: Reported on 04/03/2015 01/29/15   Riki Sheer, PA-C  sodium chloride (OCEAN) 0.65 % SOLN nasal spray Place 1 spray into both nostrils as needed for congestion. Patient not taking: Reported on 04/03/2015 06/06/14   Antony Madura, PA-C   BP 112/82 mmHg  Pulse 61  Temp(Src) 98.4 F (36.9 C) (Oral)  Resp 16  Ht  (1.803 m)  Wt 160 lb (72.576 kg)  BMI 22.33 kg/m2  SpO2 99% Physical Exam  Constitutional: He is oriented to person, place, and time. He appears well-developed and well-nourished.  HENT:  Head: Normocephalic and atraumatic.  Eyes: EOM are normal. Pupils are equal, round, and reactive to light.  Neck: Neck supple.  Cardiovascular: Normal rate, regular rhythm, normal heart sounds and intact distal pulses.   Pulmonary/Chest: Effort normal and breath sounds normal.  Abdominal: Soft. Bowel sounds are normal. He exhibits no distension. There is no tenderness.  Musculoskeletal: Normal range of motion. He exhibits tenderness. He exhibits no edema.  There is no joint effusion to the right knee. Patient has local tenderness to palpation on the medial  knee along the joint line both superior and inferiorly. Pain with flexion. The popliteal fossa tenderness. After soft and nontender. No direct patellar tenderness.  Neurological: He is alert and oriented to person, place, and time. He has normal strength. Coordination normal. GCS eye subscore is 4. GCS verbal subscore is 5. GCS motor subscore is 6.  Skin: Skin is warm, dry and intact.  Psychiatric: He has a normal mood and  affect.    ED Course  Procedures (including critical care time) Labs Review Labs Reviewed - No data to display  Imaging Review Dg Knee Complete 4 Views Right  04/03/2015  CLINICAL DATA:  Pt jumped off of the trampoline yesterday at his house, and landed on the drive way. Today he is having right medial knee pain and swelling. No hx of injury or surgery to the right knee EXAM: RIGHT KNEE - COMPLETE 4+ VIEW COMPARISON:  None. FINDINGS: There is no evidence of fracture, dislocation, or joint effusion. There is no evidence of arthropathy or other focal bone abnormality. Soft tissues are unremarkable. IMPRESSION: Negative. Electronically Signed   By: Amie Portland M.D.   On: 04/03/2015 09:09   I have personally reviewed and evaluated these images and lab results as part of my medical decision-making.   EKG Interpretation None      MDM   Final diagnoses:  Medial collateral ligament sprain of knee, right, initial encounter  Acute bronchitis, unspecified organism   Patient has acute knee injury that is most likely a medial collateral injury. The gross effusion is present. X-ray does not show any tibial plateau fracture. he'll be placed in a knee immobilizer with an Ace wrap. Crutches are provided. Plan is for orthopedic follow-up.  Patient has findings of an acute bronchitis. He has had cough for 2 weeks. He is well. Breath sounds are clear. He is counseled on signs and symptoms were to return. He is still appropriate for symptomatic treatment of over-the-counter medications if needed.    Arby Barrette, MD 04/03/15 [phone]

## 2015-05-02 ENCOUNTER — Emergency Department (INDEPENDENT_AMBULATORY_CARE_PROVIDER_SITE_OTHER)
Admission: EM | Admit: 2015-05-02 | Discharge: 2015-05-02 | Disposition: A | Discharge status: Home / Self Care | Payer: Self-pay | Source: Home / Self Care | Attending: Family Medicine | Admitting: Family Medicine

## 2015-05-02 ENCOUNTER — Encounter (HOSPITAL_COMMUNITY): Payer: Self-pay | Admitting: Emergency Medicine

## 2015-05-02 ENCOUNTER — Other Ambulatory Visit (HOSPITAL_COMMUNITY)
Admission: RE | Admit: 2015-05-02 | Discharge: 2015-05-02 | Disposition: A | Discharge status: Home / Self Care | Payer: Self-pay | Source: Ambulatory Visit | Attending: Family Medicine | Admitting: Family Medicine

## 2015-05-02 DIAGNOSIS — L0231 Cutaneous abscess of buttock: Secondary | ICD-10-CM | POA: Insufficient documentation

## 2015-05-02 MED ORDER — HYDROCODONE-ACETAMINOPHEN 5-325 MG PO TABS: 1.0000 | ORAL_TABLET | ORAL | Status: AC | PRN

## 2015-05-02 MED ORDER — LIDOCAINE HCL 2 % IJ SOLN
INTRAMUSCULAR | Status: AC
  Filled 2015-05-02: qty 20

## 2015-05-02 MED ORDER — SULFAMETHOXAZOLE-TRIMETHOPRIM 800-160 MG PO TABS: 1.0000 | ORAL_TABLET | Freq: Two times a day (BID) | ORAL | Status: AC

## 2015-05-02 MED ORDER — LIDOCAINE HCL (PF) 1 % IJ SOLN
INTRAMUSCULAR | Status: AC
  Filled 2015-05-02: qty 5

## 2015-05-02 NOTE — Discharge Instructions (Signed)
Abscess °An abscess is an infected area that contains a collection of pus and debris. It can occur in almost any part of the body. An abscess is also known as a furuncle or boil. °CAUSES  °An abscess occurs when tissue gets infected. This can occur from blockage of oil or sweat glands, infection of hair follicles, or a minor injury to the skin. As the body tries to fight the infection, pus collects in the area and creates pressure under the skin. This pressure causes pain. People with weakened immune systems have difficulty fighting infections and get certain abscesses more often.  °SYMPTOMS °Usually an abscess develops on the skin and becomes a painful mass that is red, warm, and tender. If the abscess forms under the skin, you may feel a moveable soft area under the skin. Some abscesses break open (rupture) on their own, but most will continue to get worse without care. The infection can spread deeper into the body and eventually into the bloodstream, causing you to feel ill.  °DIAGNOSIS  °Your caregiver will take your medical history and perform a physical exam. A sample of fluid may also be taken from the abscess to determine what is causing your infection. °TREATMENT  °Your caregiver may prescribe antibiotic medicines to fight the infection. However, taking antibiotics alone usually does not cure an abscess. Your caregiver may need to make a small cut (incision) in the abscess to drain the pus. In some cases, gauze is packed into the abscess to reduce pain and to continue draining the area. °HOME CARE INSTRUCTIONS  °· Only take over-the-counter or prescription medicines for pain, discomfort, or fever as directed by your caregiver. °· If you were prescribed antibiotics, take them as directed. Finish them even if you start to feel better. °· If gauze is used, follow your caregiver's directions for changing the gauze. °· To avoid spreading the infection: °· Keep your draining abscess covered with a  bandage. °· Wash your hands well. °· Do not share personal care items, towels, or whirlpools with others. °· Avoid skin contact with others. °· Keep your skin and clothes clean around the abscess. °· Keep all follow-up appointments as directed by your caregiver. °SEEK MEDICAL CARE IF:  °· You have increased pain, swelling, redness, fluid drainage, or bleeding. °· You have muscle aches, chills, or a general ill feeling. °· You have a fever. °MAKE SURE YOU:  °· Understand these instructions. °· Will watch your condition. °· Will get help right away if you are not doing well or get worse. °  °This information is not intended to replace advice given to you by your health care provider. Make sure you discuss any questions you have with your health care provider. °  °Document Released: 11/08/2004 Document Revised: 07/31/2011 Document Reviewed: 04/13/2011 °Elsevier Interactive Patient Education ©2016 Elsevier Inc. ° °Incision and Drainage °Incision and drainage is a procedure in which a sac-like structure (cystic structure) is opened and drained. The area to be drained usually contains material such as pus, fluid, or blood.  °LET YOUR CAREGIVER KNOW ABOUT:  °· Allergies to medicine. °· Medicines taken, including vitamins, herbs, eyedrops, over-the-counter medicines, and creams. °· Use of steroids (by mouth or creams). °· Previous problems with anesthetics or numbing medicines. °· History of bleeding problems or blood clots. °· Previous surgery. °· Other health problems, including diabetes and kidney problems. °· Possibility of pregnancy, if this applies. °RISKS AND COMPLICATIONS °· Pain. °· Bleeding. °· Scarring. °· Infection. °BEFORE THE PROCEDURE  °  You may need to have an ultrasound or other imaging tests to see how large or deep your cystic structure is. Blood tests may also be used to determine if you have an infection or how severe the infection is. You may need to have a tetanus shot. °PROCEDURE  °The affected area  is cleaned with a cleaning fluid. The cyst area will then be numbed with a medicine (local anesthetic). A small incision will be made in the cystic structure. A syringe or catheter may be used to drain the contents of the cystic structure, or the contents may be squeezed out. The area will then be flushed with a cleansing solution. After cleansing the area, it is often gently packed with a gauze or another wound dressing. Once it is packed, it will be covered with gauze and tape or some other type of wound dressing.  °AFTER THE PROCEDURE  °· Often, you will be allowed to go home right after the procedure. °· You may be given antibiotic medicine to prevent or heal an infection. °· If the area was packed with gauze or some other wound dressing, you will likely need to come back in 1 to 2 days to get it removed. °· The area should heal in about 14 days. °  °This information is not intended to replace advice given to you by your health care provider. Make sure you discuss any questions you have with your health care provider. °  °Document Released: 07/25/2000 Document Revised: 07/31/2011 Document Reviewed: 03/26/2011 °Elsevier Interactive Patient Education ©2016 Elsevier Inc. ° °

## 2015-05-02 NOTE — ED Provider Notes (Signed)
CSN: [id number]     Arrival date & time 05/02/15  1259 History   First MD Initiated Contact with Patient 05/02/15 1306     Chief Complaint  Patient presents with  . Abscess   (Consider location/radiation/quality/duration/timing/severity/associated sxs/prior Treatment) HPI Comments: 35 year old male complaining of a painful area to the right buttock for 2 days. It has increased in size and has increased in pain and tenderness as well. It is located to the right medial buttock adjacent to the anus. Does not appear to involve the immediate perirectal area.    History reviewed. No pertinent past medical history. History reviewed. No pertinent past surgical history. No family history on file. Social History  Substance Use Topics  . Smoking status: Current Every Day Smoker    Types: Cigarettes  . Smokeless tobacco: None  . Alcohol Use: Yes     Comment: occasional    Review of Systems  Constitutional: Positive for activity change.  HENT: Negative.   Respiratory: Negative.   Gastrointestinal: Negative.  Negative for abdominal pain and anal bleeding.  Skin: Positive for wound.  Neurological: Negative.   All other systems reviewed and are negative.   Allergies  Review of patient's allergies indicates no known allergies.  Home Medications   Prior to Admission medications   Medication Sig Start Date End Date Taking? Authorizing Provider  HYDROcodone-acetaminophen (NORCO/VICODIN) 5-325 MG tablet Take 1 tablet by mouth every 4 (four) hours as needed. 05/02/15   Hayden Rasmussen, NP  sulfamethoxazole-trimethoprim (BACTRIM DS,SEPTRA DS) 800-160 MG tablet Take 1 tablet by mouth 2 (two) times daily. 05/02/15 05/09/15  Hayden Rasmussen, NP   Meds Ordered and Administered this Visit  Medications - No data to display  BP 119/76 mmHg  Pulse 70  Temp(Src) 98.5 F (36.9 C) (Oral)  Resp 16  SpO2 99% No data found.   Physical Exam  Constitutional: He is oriented to person, place, and time. He appears  well-developed and well-nourished. No distress.  Eyes: EOM are normal.  Neck: Normal range of motion. Neck supple.  Cardiovascular: Normal rate.   Pulmonary/Chest: Effort normal. No respiratory distress.  Musculoskeletal: He exhibits no edema.  Neurological: He is alert and oriented to person, place, and time. He exhibits normal muscle tone.  Skin: Skin is warm and dry. No erythema.  Right medial buttock with approximately 4 cm x 3 cm abscess. Tender, painful but no erythema or lymphangitis. This does not involve the perirectal tissues.  Psychiatric: He has a normal mood and affect.  Nursing note and vitals reviewed.   ED Course  .Marland KitchenIncision and Drainage Date/Time: 05/02/2015 1:43 PM Performed by: Phineas Real, Juandiego Kolenovic Authorized by: Bradd Canary D Consent: Verbal consent obtained. Risks and benefits: risks, benefits and alternatives were discussed Consent given by: patient Patient understanding: patient states understanding of the procedure being performed Patient identity confirmed: verbally with patient Type: abscess Body area: lower extremity Location details: right buttock Anesthesia: local infiltration Local anesthetic: lidocaine 2% without epinephrine Anesthetic total: 9 ml Patient sedated: no Scalpel size: 11 Incision type: single with marsupialization Incision depth: subcutaneous Complexity: complex Drainage: purulent Drainage amount: copious Wound treatment: drain placed Packing material: 1/4 in iodoform gauze Patient tolerance: Patient tolerated the procedure well with no immediate complications   (including critical care time)  Labs Review Labs Reviewed  CULTURE, ROUTINE-ABSCESS    Imaging Review No results found.   Visual Acuity Review  Right Eye Distance:   Left Eye Distance:   Bilateral Distance:    Right Eye Near:  Left Eye Near:    Bilateral Near:         MDM   1. Abscess of buttock, right    I and D Dressing RTO 2 d wound chk. Meds  ordered this encounter  Medications  . sulfamethoxazole-trimethoprim (BACTRIM DS,SEPTRA DS) 800-160 MG tablet    Sig: Take 1 tablet by mouth 2 (two) times daily.    Dispense:  14 tablet    Refill:  0    Order Specific Question:  Supervising Provider    Answer:  Linna HoffKINDL, JAMES D [phone][[id number]]  . HYDROcodone-acetaminophen (NORCO/VICODIN) 5-325 MG tablet    Sig: Take 1 tablet by mouth every 4 (four) hours as needed.    Dispense:  15 tablet    Refill:  0    Order Specific Question:  Supervising Provider    Answer:  Linna HoffKINDL, JAMES D [[id number]]   Wound culture pending   Hayden Rasmussenavid Kimara Bencomo, NP 05/02/15 1349  Hayden Rasmussenavid Jourdain Guay, NP 05/02/15 1350

## 2015-05-02 NOTE — ED Notes (Signed)
Pt here with right buttock hard abscess that appeared 2 days ago Painful with sitting or standing Denies fever,chills Warm compresses used

## 2015-05-02 NOTE — ED Notes (Signed)
Dressing covered with 4x4 gauze and taped down

## 2015-05-04 ENCOUNTER — Encounter (HOSPITAL_COMMUNITY): Payer: Self-pay | Admitting: *Deleted

## 2015-05-04 ENCOUNTER — Emergency Department (INDEPENDENT_AMBULATORY_CARE_PROVIDER_SITE_OTHER)
Admission: EM | Admit: 2015-05-04 | Discharge: 2015-05-04 | Disposition: A | Discharge status: Home / Self Care | Payer: Self-pay | Source: Home / Self Care | Attending: Emergency Medicine | Admitting: Emergency Medicine

## 2015-05-04 DIAGNOSIS — L0291 Cutaneous abscess, unspecified: Secondary | ICD-10-CM

## 2015-05-04 DIAGNOSIS — Z4800 Encounter for change or removal of nonsurgical wound dressing: Secondary | ICD-10-CM

## 2015-05-04 NOTE — Discharge Instructions (Signed)
Incision and Drainage Incision and drainage is a procedure in which a sac-like structure (cystic structure) is opened and drained. The area to be drained usually contains material such as pus, fluid, or blood.  LET YOUR CAREGIVER KNOW ABOUT:   Allergies to medicine.  Medicines taken, including vitamins, herbs, eyedrops, over-the-counter medicines, and creams.  Use of steroids (by mouth or creams).  Previous problems with anesthetics or numbing medicines.  History of bleeding problems or blood clots.  Previous surgery.  Other health problems, including diabetes and kidney problems.  Possibility of pregnancy, if this applies. RISKS AND COMPLICATIONS  Pain.  Bleeding.  Scarring.  Infection. BEFORE THE PROCEDURE  You may need to have an ultrasound or other imaging tests to see how large or deep your cystic structure is. Blood tests may also be used to determine if you have an infection or how severe the infection is. You may need to have a tetanus shot. PROCEDURE  The affected area is cleaned with a cleaning fluid. The cyst area will then be numbed with a medicine (local anesthetic). A small incision will be made in the cystic structure. A syringe or catheter may be used to drain the contents of the cystic structure, or the contents may be squeezed out. The area will then be flushed with a cleansing solution. After cleansing the area, it is often gently packed with a gauze or another wound dressing. Once it is packed, it will be covered with gauze and tape or some other type of wound dressing. AFTER THE PROCEDURE   Often, you will be allowed to go home right after the procedure.  You may be given antibiotic medicine to prevent or heal an infection.  If the area was packed with gauze or some other wound dressing, you will likely need to come back in 1 to 2 days to get it removed.  The area should heal in about 14 days.   This information is not intended to replace advice given  to you by your health care provider. Make sure you discuss any questions you have with your health care provider.   Document Released: 07/25/2000 Document Revised: 07/31/2011 Document Reviewed: 03/26/2011 Elsevier Interactive Patient Education 2016 [employer].  Dressing Change A dressing is a material placed over wounds. It keeps the wound clean, dry, and protected from further injury. This provides an environment that favors wound healing.  BEFORE YOU BEGIN  Get your supplies together. Things you may need include:  Saline solution.  Flexible gauze dressing.  Medicated cream.  Tape.  Gloves.  Abdominal dressing pads.  Gauze squares.  Plastic bags.  Take pain medicine 30 minutes before the dressing change if you need it.  Take a shower before you do the first dressing change of the day. Use plastic wrap or a plastic bag to prevent the dressing from getting wet. REMOVING YOUR OLD DRESSING   Wash your hands with soap and water. Dry your hands with a clean towel.  Put on your gloves.  Remove any tape.  Carefully remove the old dressing. If the dressing sticks, you may dampen it with warm water to loosen it, or follow your caregiver's specific directions.  Remove any gauze or packing tape that is in your wound.  Take off your gloves.  Put the gloves, tape, gauze, or any packing tape into a plastic bag. CHANGING YOUR DRESSING  Open the supplies.  Take the cap off the saline solution.  Open the gauze package so that the gauze  remains on the inside of the package.  Put on your gloves.  Clean your wound as told by your caregiver.  If you have been told to keep your wound dry, follow those instructions.  Your caregiver may tell you to do one or more of the following:  Pick up the gauze. Pour the saline solution over the gauze. Squeeze out the extra saline solution.  Put medicated cream or other medicine on your wound if you have been told to do so.  Put the  solution soaked gauze only in your wound, not on the skin around it.  Pack your wound loosely or as told by your caregiver.  Put dry gauze on your wound.  Put abdominal dressing pads over the dry gauze if your wet gauze soaks through.  Tape the abdominal dressing pads in place so they will not fall off. Do not wrap the tape completely around the affected part (arm, leg, abdomen).  Wrap the dressing pads with a flexible gauze dressing to secure it in place.  Take off your gloves. Put them in the plastic bag with the old dressing. Tie the bag shut and throw it away.  Keep the dressing clean and dry until your next dressing change.  Wash your hands. SEEK MEDICAL CARE IF:  Your skin around the wound looks red.  Your wound feels more tender or sore.  You see pus in the wound.  Your wound smells bad.  You have a fever.  Your skin around the wound has a rash that itches and burns.  You see black or yellow skin in your wound that was not there before.  You feel nauseous, throw up, and feel very tired.   This information is not intended to replace advice given to you by your health care provider. Make sure you discuss any questions you have with your health care provider.   Document Released: 03/08/2004 Document Revised: 04/23/2011 Document Reviewed: 12/11/2010 Elsevier Interactive Patient Education [employer]2016 Elsevier Inc.

## 2015-05-04 NOTE — ED Notes (Signed)
Pt  Here  for  Packing  Removal  States  Doing better

## 2015-05-04 NOTE — ED Provider Notes (Signed)
CSN: [id number]     Arrival date & time 05/04/15  1300 History   First MD Initiated Contact with Patient 05/04/15 1323     Chief Complaint  Patient presents with  . Wound Check   (Consider location/radiation/quality/duration/timing/severity/associated sxs/prior Treatment) HPI Patient is here for packing removal (I&D of abscess on his buttock. Patient states he is feeling better. Fever has gone away. He is able to sit comfortably at this time now. He states he continues to have some drainage from the wound. Patient is also looking to return to work as soon as possible. Pain score at this time is about 3. History reviewed. No pertinent past medical history. History reviewed. No pertinent past surgical history. History reviewed. No pertinent family history. Social History  Substance Use Topics  . Smoking status: Current Every Day Smoker    Types: Cigarettes  . Smokeless tobacco: None  . Alcohol Use: Yes     Comment: occasional    Review of Systems Packing removal Allergies  Review of patient's allergies indicates no known allergies.  Home Medications   Prior to Admission medications   Medication Sig Start Date End Date Taking? Authorizing Provider  HYDROcodone-acetaminophen (NORCO/VICODIN) 5-325 MG tablet Take 1 tablet by mouth every 4 (four) hours as needed. 05/02/15   Hayden Rasmussenavid Mabe, NP  sulfamethoxazole-trimethoprim (BACTRIM DS,SEPTRA DS) 800-160 MG tablet Take 1 tablet by mouth 2 (two) times daily. 05/02/15 05/09/15  Hayden Rasmussenavid Mabe, NP   Meds Ordered and Administered this Visit  Medications - No data to display  BP 116/77 mmHg  Pulse 79  Temp(Src) 99 F (37.2 C) (Oral)  Resp 16  SpO2 98% No data found.   Physical Exam  Constitutional: He is oriented to person, place, and time. He appears well-developed and well-nourished.  Genitourinary:  Abscess packing is removed without difficulty. There remains some induration and a scant amount of pus still expressed from the wound. No  cellulitis is noted.  Neurological: He is alert and oriented to person, place, and time.  Skin: Skin is warm and dry.  Nursing note and vitals reviewed.   ED Course  Procedures (including critical care time)  Labs Review Labs Reviewed - No data to display  Imaging Review No results found.   Visual Acuity Review  Right Eye Distance:   Left Eye Distance:   Bilateral Distance:    Right Eye Near:   Left Eye Near:    Bilateral Near:       Treatment plan is to continue antibiotics at this time. Also sitz baths. Patient is clear to return to work, limited capacity.  MDM   1. Abscess   2. Abscess packing removal     Patient is reassured that there are no issues that require transfer to higher level of care at this time or additional tests. Patient is advised to continue home symptomatic treatment. Patient is advised that if there are new or worsening symptoms to attend the emergency department, contact primary care provider, or return to [facility]. Instructions of care provided discharged home in stable condition. Return to work/school note provided.   THIS NOTE WAS GENERATED USING A VOICE RECOGNITION SOFTWARE PROGRAM. ALL REASONABLE EFFORTS  WERE MADE TO PROOFREAD THIS DOCUMENT FOR ACCURACY.  I have verbally reviewed the discharge instructions with the patient. A printed AVS was given to the patient.  All questions were answered prior to discharge.      Tharon AquasFrank C Patrick, PA 05/04/15 1426

## 2015-05-05 LAB — CULTURE, ROUTINE-ABSCESS

## 2017-01-16 IMAGING — CR DG KNEE COMPLETE 4+V*R*
4 series · 4 of 4 positions shown · non-contrast
Comparison: None.

CLINICAL DATA: Pt jumped off of the trampoline yesterday at his
house, and landed on the drive way. Today he is having right medial
knee pain and swelling. No hx of injury or surgery to the right knee

EXAM:
RIGHT KNEE - COMPLETE 4+ VIEW

[knee ap]
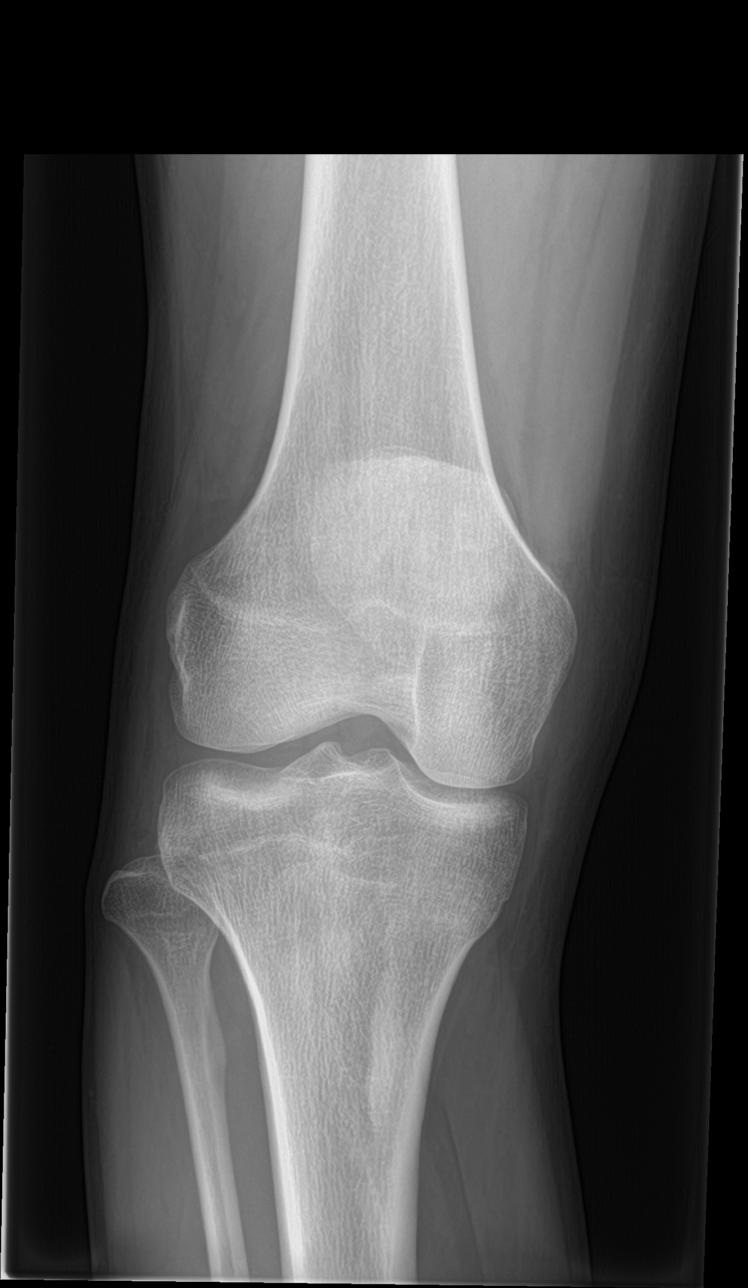

[knee lat]
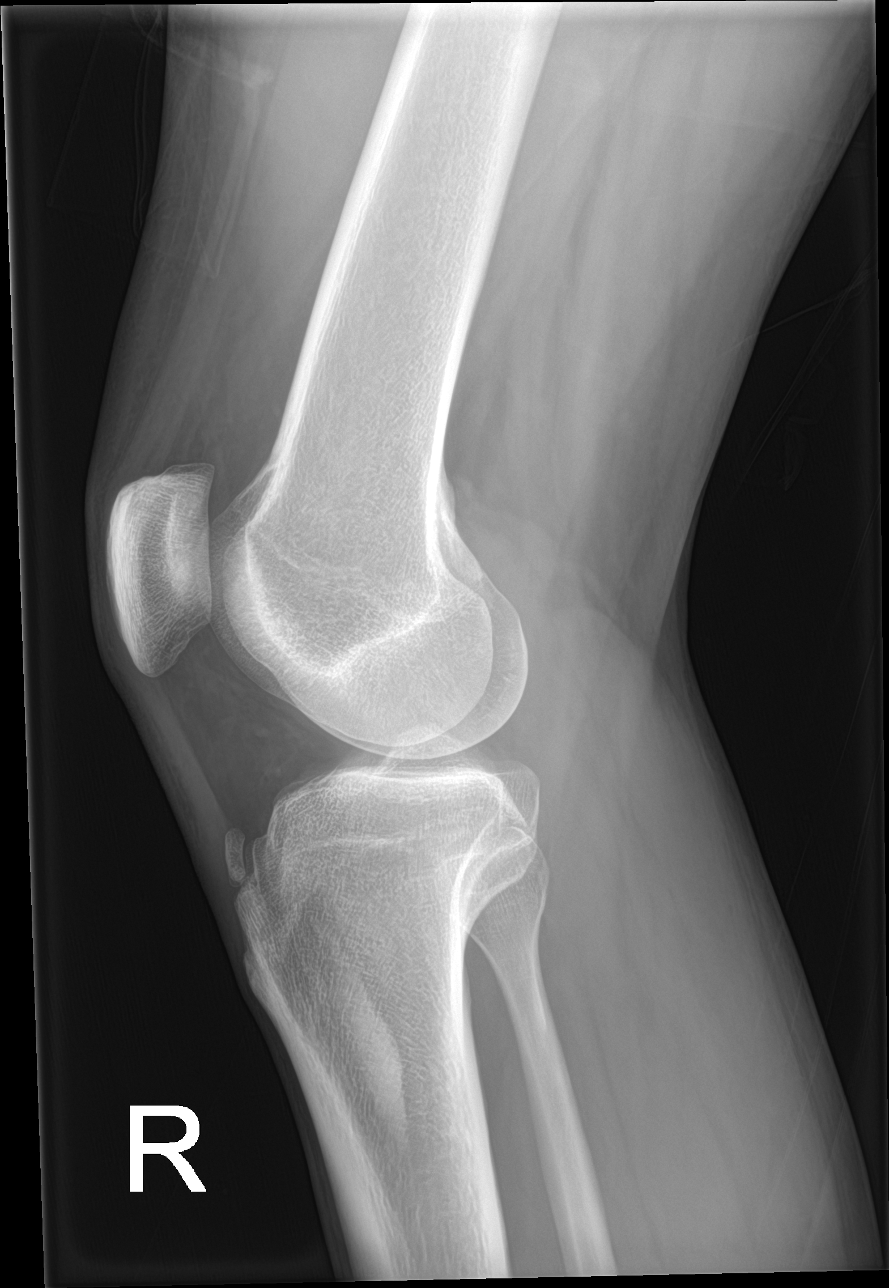

[knee obl (1 of 2)]
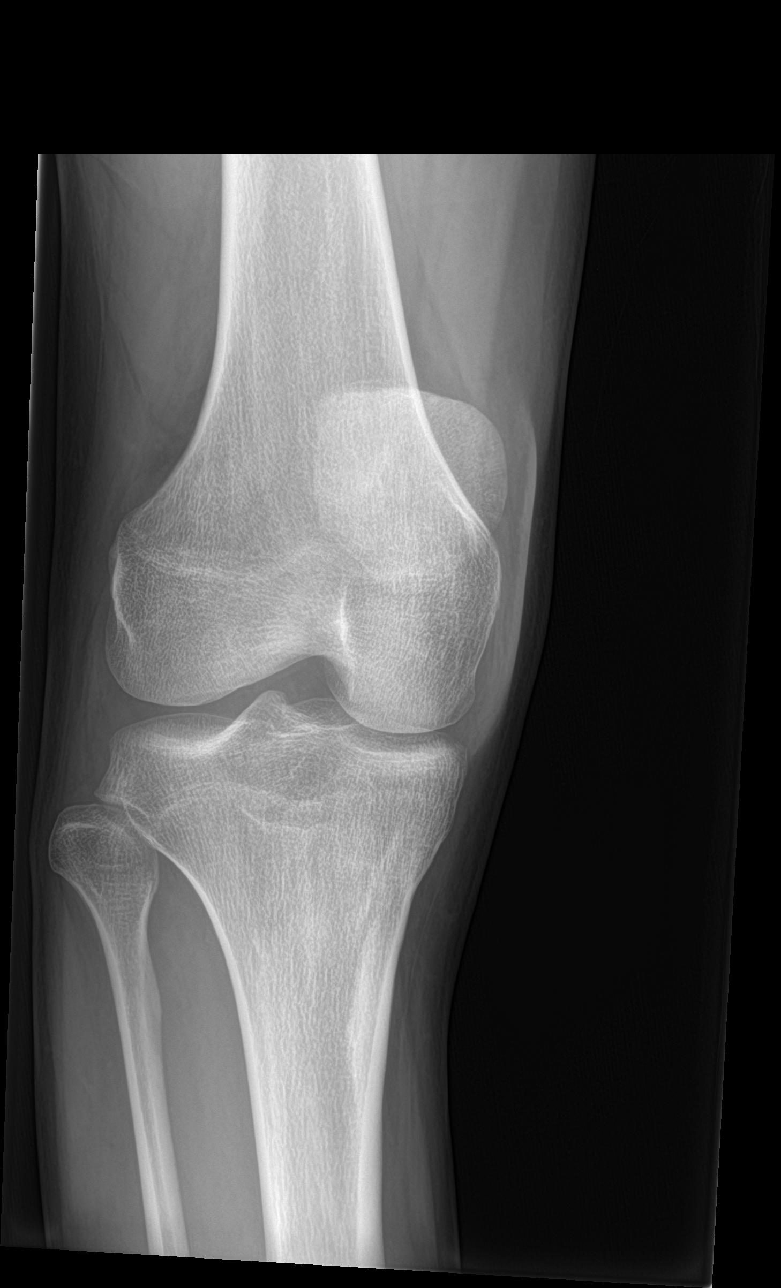

[knee obl (2 of 2)]
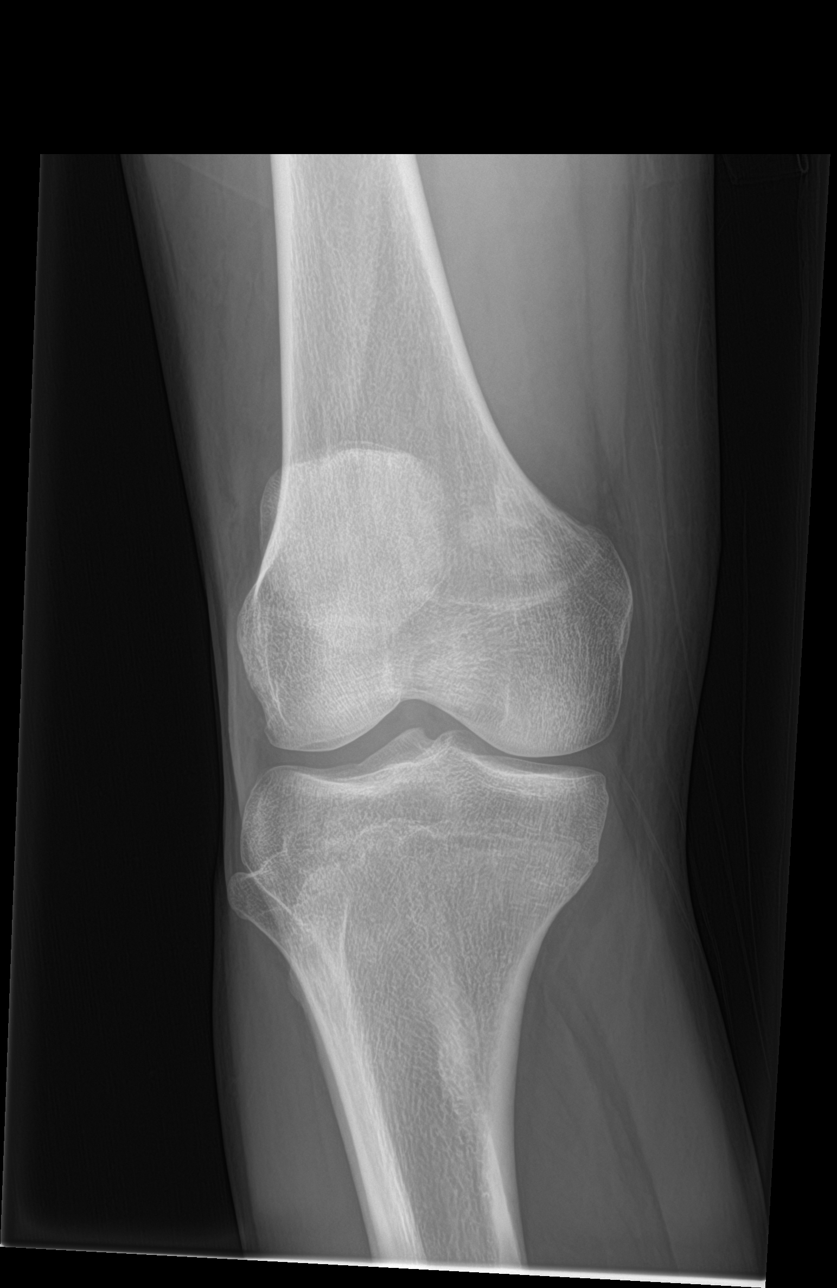

[4 of 4 positions shown; findings below may reference images not displayed]

FINDINGS: There is no evidence of fracture, dislocation, or joint effusion.
There is no evidence of arthropathy or other focal bone abnormality.
Soft tissues are unremarkable.
IMPRESSION: Negative.
